# Patient Record
Sex: Male | Born: 1995 | Race: White | Hispanic: No | Marital: Single
Health system: Midwestern US, Community
[De-identification: ages and names within clinical notes are randomized; demographics above are authoritative.]

## PROBLEM LIST (undated history)

## (undated) DIAGNOSIS — I868 Varicose veins of other specified sites: Secondary | ICD-10-CM

---
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
## 2014-05-23 ENCOUNTER — Ambulatory Visit: Payer: Self-pay | Admitting: Internal Medicine

## 2014-05-23 LAB — URINALYSIS, COMPLETE
BLOOD: NEGATIVE
Bacteria: NEGATIVE
Bilirubin,UR: NEGATIVE
Glucose,UR: NEGATIVE
Ketone: NEGATIVE
Leukocyte Esterase: NEGATIVE
Nitrite: NEGATIVE
PH: 6 (ref 5.0–8.0)
Protein: NEGATIVE
Specific Gravity: 1.025 (ref 1.000–1.030)
Squamous Epithelial: NONE SEEN

## 2014-05-23 LAB — GC/CHLAMYDIA PROBE AMP

## 2014-11-05 NOTE — Patient Instructions (Signed)
Attempted to reach patient by telephone to review preop instructions. No answer at telephone number listed. Name on voicemail greeting does not match patients name, no message left.

## 2014-11-06 ENCOUNTER — Encounter

## 2014-11-06 ENCOUNTER — Inpatient Hospital Stay: Admit: 2014-11-06 | Attending: Urology | Primary: Family Medicine

## 2014-11-06 DIAGNOSIS — I868 Varicose veins of other specified sites: Secondary | ICD-10-CM

## 2014-11-06 LAB — POCT VENOUS
CO2: 27 mmol/L (ref 21–32)
Calcium, Ionized: 1.31 mmol/L (ref 1.12–1.32)
GFR African American: >60
GFR Non-African American: >60 (ref >60)
POC Anion Gap: 12 (ref 10–20)
POC BUN: 15 mg/dL (ref 7–18)
POC Chloride: 100 mmol/L (ref 99–110)
POC Creatinine: 1.1 mg/dL (ref 0.9–1.3)
POC Glucose: 84 mg/dl (ref 70–99)
POC Potassium: 3.8 mmol/L (ref 3.5–5.1)
POC Sodium: 139 mmol/L (ref 136–145)

## 2014-11-06 LAB — CBC
Hematocrit: 47.5 % (ref 40.5–52.5)
Hemoglobin: 16.1 g/dL (ref 13.5–17.5)
MCH: 31.9 pg (ref 26.0–34.0)
MCHC: 33.9 g/dL (ref 31.0–36.0)
MCV: 93.9 fL (ref 80.0–100.0)
MPV: 9.5 fL (ref 5.0–10.5)
Platelets: 183 10*3/uL (ref 135–450)
RBC: 5.05 M/uL (ref 4.20–5.90)
RDW: 13 % (ref 12.4–15.4)
WBC: 5.1 10*3/uL (ref 4.0–11.0)

## 2014-11-06 LAB — PROTIME-INR
INR: 1.04 (ref 0.85–1.16)
Protime: 11.9 s (ref 9.8–13.0)

## 2014-11-06 MED ORDER — ONDANSETRON HCL 4 MG/2ML IJ SOLN
4 MG/2ML | Freq: Three times a day (TID) | INTRAMUSCULAR | Status: DC | PRN
Start: 2014-11-06 — End: 2014-11-07

## 2014-11-06 MED ORDER — HEPARIN (PORCINE) IN NACL 2-0.9 UNIT/ML-% IJ SOLN
INTRAMUSCULAR | Status: AC
Start: 2014-11-06 — End: ?

## 2014-11-06 MED ORDER — FENTANYL CITRATE (PF) 100 MCG/2ML IJ SOLN
100 MCG/2ML | INTRAMUSCULAR | Status: AC
Start: 2014-11-06 — End: ?

## 2014-11-06 MED ORDER — MIDAZOLAM HCL 2 MG/2ML IJ SOLN
2 MG/ML | INTRAMUSCULAR | Status: AC
Start: 2014-11-06 — End: ?

## 2014-11-06 MED ORDER — ACETAMINOPHEN 325 MG PO TABS
325 MG | ORAL | Status: DC | PRN
Start: 2014-11-06 — End: 2014-11-07

## 2014-11-06 MED ORDER — LIDOCAINE HCL 2 % IJ SOLN
2 % | INTRAMUSCULAR | Status: AC
Start: 2014-11-06 — End: ?

## 2014-11-06 MED ORDER — SODIUM TETRADECYL SULFATE 3 % IV SOLN
3 % | Freq: Once | INTRAVENOUS | Status: DC
Start: 2014-11-06 — End: 2014-11-07

## 2014-11-06 MED ORDER — PROMETHAZINE HCL 25 MG/ML IJ SOLN
25 MG/ML | INTRAMUSCULAR | Status: AC
Start: 2014-11-06 — End: ?

## 2014-11-06 MED FILL — PROMETHAZINE HCL 25 MG/ML IJ SOLN: 25 MG/ML | INTRAMUSCULAR | Qty: 1

## 2014-11-06 MED FILL — ONDANSETRON HCL 4 MG/2ML IJ SOLN: 4 MG/2ML | INTRAMUSCULAR | Qty: 2

## 2014-11-06 MED FILL — HEPARIN (PORCINE) IN NACL 2-0.9 UNIT/ML-% IJ SOLN: INTRAMUSCULAR | Qty: 500

## 2014-11-06 MED FILL — FENTANYL CITRATE (PF) 100 MCG/2ML IJ SOLN: 100 MCG/2ML | INTRAMUSCULAR | Qty: 2

## 2014-11-06 MED FILL — MIDAZOLAM HCL 2 MG/2ML IJ SOLN: 2 MG/ML | INTRAMUSCULAR | Qty: 2

## 2014-11-06 MED FILL — ACETAMINOPHEN 325 MG PO TABS: 325 MG | ORAL | Qty: 2

## 2014-11-06 MED FILL — LIDOCAINE HCL 2 % IJ SOLN: 2 % | INTRAMUSCULAR | Qty: 20

## 2014-11-06 MED FILL — SOTRADECOL 3 % IV SOLN: 3 % | INTRAVENOUS | Qty: 2

## 2014-11-06 NOTE — Other (Signed)
Sedation Post Procedure Note    Patient Name: Ricardo Moore   Date of Birth:08/02/1995  Room/Bed: Room/bed info not found  Medical Record Number: [id number]  Date: 11/06/2014   Time: 11:16 AM         Physicians/Assistants: Ulla GalloJoseph E Soffia Doshier, MD    Procedure Performed:  Left varicocele embolization and STS sclerosis    Post-Sedation Vital Signs:  There were no vitals filed for this visit.   Vital signs were reviewed and were stable after the procedure (see flow sheet for vitals)            Post-Sedation Exam: Lungs: clear           Complications: none    Electronically signed by Ulla GalloJoseph E Frankye Schwegel, MD on 11/06/2014 at 11:16 AM

## 2014-11-06 NOTE — Brief Op Note (Signed)
Brief Postoperative Note    Ricardo Moore  Date of Birth:  03/11/1996  [phone]    Pre-operative Diagnosis: left varicocele embolization and sclerotherapy    Post-operative Diagnosis: Same    Procedure: same as above    Anesthesia: Local and Moderate Sedation    Surgeons/Assistants: Robb MatarBernstein    Estimated Blood Loss: less than 5cc    Complications: None    Specimens: Was Not Obtained    Findings: good stasis    Electronically signed by Ulla GalloJoseph E Chariti Havel, MD on 11/06/2014 at 11:17 AM

## 2014-11-06 NOTE — Pre Sedation (Signed)
Sedation Pre-Procedure Note    Patient Name: Ricardo Moore   Date of Birth:01/21/1996  Room/Bed: Room/bed info not found  Medical Record Number: [id number] [id number]  Date: 11/06/2014   Time: 11:10 AM       Indication:  Recurrent varicocle    Consent: I have discussed with the patient and/or the patient representative the indication, alternatives, and the possible risks and/or complications of the planned procedure and the anesthesia methods. The patient and/or patient representative appear to understand and agree to proceed.    Vital Signs:   There were no vitals filed for this visit.    Past Medical History:   has no past medical history on file.    Past Surgical History:   has past surgical history that includes Varicocelectomy (Left).    Medications:   Scheduled Meds:   ??? sodium tetradecyl sulfate  2 mL Intravenous Once     Continuous Infusions:    PRN Meds: acetaminophen, ondansetron  Home Meds:   Prior to Admission medications    Not on File     Coumadin Use Last 7 Days:  no  Antiplatelet drug therapy use last 7 days: no  Other anticoagulant use last 7 days: no  Additional Medication Information:  -      Pre-Sedation Documentation and Exam:   I have personally completed a history, physical exam & review of systems for this patient (see notes).    Mallampati Airway Assessment:  normal    Prior History of Anesthesia Complications:   none    ASA Classification:  Class 1 - A normal healthy patient    Sedation/ Anesthesia Plan:   intravenous sedation    Medications Planned:   midazolam (Versed) intravenously and fentanyl intravenously    Patient is an appropriate candidate for plan of sedation: yes    Electronically signed by Ulla GalloJoseph E Shanyn Preisler, MD on 11/06/2014 at 11:10 AM

## 2014-11-06 NOTE — H&P (Signed)
IR   History and Physical       Chief Complaint: recurrent varicocele  left    History of Present Illness:    Ricardo Moore is a 19 y.o. male who presents with pain left scrotum  Recurrent varicocle    Past Medical History:    History reviewed. No pertinent past medical history.    Past Surgical History:           Procedure Laterality Date   ??? Varicocelectomy Left 2-3 years ago       Allergies:  Review of patient's allergies indicates no known allergies.    Medications:   Home Meds  No current outpatient prescriptions on file prior to encounter.     No current facility-administered medications on file prior to encounter.       Current Meds  none  Family History:   History reviewed. No pertinent family history.    Social History:   TOBACCO:   reports that he has never smoked. He does not have any smokeless tobacco history on file.  ETOH:   has no alcohol history on file.  DRUGS:   has no drug history on file.    ROS: A 10 point review of systems was conducted, significant findings as noted in HPI.    Physical exam:     There were no vitals filed for this visit.    General appearance: alert, resting in bed  Neuro: A&Ox3, PERRL  Neck: trachea midline, no JVD  Lungs: CTAB, no r/r/w  Heart: RRR, no m/r/g  Abdomen: soft, non-tender, non-distended, +BS  Skin: pink, no rashes  Extremities: no edema, cap refill<2sec     Labs:    CBC: Recent Labs      11/06/14   0705   WBC  5.1   HGB  16.1   HCT  47.5   MCV  93.9   PLT  183     BMP: Recent Labs      11/06/14   0710   CO2  27   CREATININE  1.1     PT/INR:   Recent Labs      11/06/14   0705   PROTIME  11.9   INR  1.04     APTT: No results for input(s): APTT in the last 72 hours.  Liver Profile:  No results found for: AST, ALT, ALB, BILIDIR, BILITOT, ALKPHOS, GGT, 5NUCNo results found for: CHOL, HDL, TRIG  UA: No results found for: NITRITE, COLORU, PHUR, LABCAST, WBCUA, RBCUA, MUCUS, TRICHOMONAS, YEAST, BACTERIA, CLARITYU, SPECGRAV, LEUKOCYTESUR, UROBILINOGEN, BILIRUBINUR,  BLOODU, GLUCOSEU, AMORPHOUS    US  Done at urology group    Assessment/Plan:  19 y.o. male who presents with recurrent varicocele left side    Cleared for  embolization    Ulla GalloJoseph E Abhi Moccia, MD  11/06/2014 800am  [phone]

## 2015-02-06 DIAGNOSIS — R109 Unspecified abdominal pain: Secondary | ICD-10-CM

## 2015-02-06 DIAGNOSIS — B081 Molluscum contagiosum: Secondary | ICD-10-CM

## 2015-02-27 DIAGNOSIS — R197 Diarrhea, unspecified: Secondary | ICD-10-CM | POA: Diagnosis not present

## 2015-03-11 ENCOUNTER — Ambulatory Visit: Payer: Self-pay | Admitting: Gastroenterology

## 2015-03-18 ENCOUNTER — Ambulatory Visit: Payer: Self-pay | Admitting: Gastroenterology

## 2015-05-23 ENCOUNTER — Other Ambulatory Visit: Payer: Self-pay | Admitting: Family Medicine

## 2015-05-23 ENCOUNTER — Ambulatory Visit
Admission: RE | Admit: 2015-05-23 | Discharge: 2015-05-23 | Disposition: A | Discharge status: Home / Self Care | Payer: Managed Care, Other (non HMO) | Source: Ambulatory Visit | Attending: Family Medicine | Admitting: Family Medicine

## 2015-05-23 DIAGNOSIS — H5712 Ocular pain, left eye: Secondary | ICD-10-CM | POA: Diagnosis not present

## 2015-05-23 DIAGNOSIS — N50812 Left testicular pain: Secondary | ICD-10-CM | POA: Diagnosis not present

## 2015-05-23 DIAGNOSIS — N50819 Testicular pain, unspecified: Secondary | ICD-10-CM

## 2015-05-23 DIAGNOSIS — H0019 Chalazion unspecified eye, unspecified eyelid: Secondary | ICD-10-CM | POA: Diagnosis not present

## 2016-02-27 ENCOUNTER — Encounter: Payer: Self-pay | Admitting: Family Medicine

## 2016-02-27 ENCOUNTER — Ambulatory Visit (INDEPENDENT_AMBULATORY_CARE_PROVIDER_SITE_OTHER): Payer: Managed Care, Other (non HMO) | Admitting: Family Medicine

## 2016-02-27 VITALS — BP 131/65 | HR 51 | Temp 96.7°F

## 2016-02-27 DIAGNOSIS — B079 Viral wart, unspecified: Secondary | ICD-10-CM

## 2016-02-27 DIAGNOSIS — R21 Rash and other nonspecific skin eruption: Secondary | ICD-10-CM

## 2016-02-27 NOTE — Progress Notes (Signed)
Patient presents today with symptoms of rash. Patient states that girlfriend noticed rash on back a few days ago. Patient states that the rash is not itchy or painful. The trainer put some hydrocortisone cream on it yesterday.  Patient also has a viral wart on his right third digit on the palmar aspect. He would like to know if I can freeze the wart.  ROS: Negative except mentioned above.  Vitals as per Epic.  GENERAL: NAD RESP: CTA B CARD: RRR SKIN: Oval and circular flat rash on back, there does appear to be some scaliness within the center, there are no streaks or any discharge from the rash, possible Jake SharkHarold Patch noted in the left groin area Approximately 3 mm viral wart on palmar aspect of right third digit NEURO: CN II-XII grossly intact   A/P: 1)Skin Rash - unsure whether the hydrocortisone has changed the color of the rash, appears to look more like pityriasis rosea or tinea versicolor, patient is not bothered by the rash, would recommend oral antihistamine when necessary if itching, follow-up if symptoms persist or worsen as discussed.  2)Wart on finger - verbal consent was obtained and Histofreeze was used, cryotherapy was used on the area, would recommend that patient keep area clean and dry, repeat procedure may be needed if wart persistent, follow-up when necessary.

## 2016-03-04 ENCOUNTER — Ambulatory Visit: Payer: Managed Care, Other (non HMO) | Admitting: Family Medicine

## 2016-03-05 ENCOUNTER — Ambulatory Visit: Payer: Managed Care, Other (non HMO) | Admitting: Family Medicine

## 2016-03-05 ENCOUNTER — Ambulatory Visit (INDEPENDENT_AMBULATORY_CARE_PROVIDER_SITE_OTHER): Payer: Managed Care, Other (non HMO) | Admitting: Family Medicine

## 2016-03-05 VITALS — Temp 97.1°F

## 2016-03-05 DIAGNOSIS — B360 Pityriasis versicolor: Secondary | ICD-10-CM

## 2016-03-05 MED ORDER — FLUCONAZOLE 150 MG PO TABS: ORAL_TABLET | ORAL | 0 refills | Status: AC

## 2016-03-12 NOTE — Progress Notes (Signed)
Patient presents for follow-up regarding his rash on his torso. Patient states that the areas are more hypopigmented now he denies any itching of the site. He denies any drainage or discharge from the site. There was some confusion initially as to the etiology when he came initially because he had been applying hydrocortisone to the area.  ROS: Negative except mentioned above.  Vitals as per Epic.  GENERAL: NAD HEENT: no pharyngeal erythema, no exudate, no erythema of TMs, no cervical LAD RESP: CTA B CARD: RRR SKIN: Hypopigmented areas noted on the upper back primarily, no vesicular lesions or discharge NEURO: CN II-XII groslly intact   A/P: Skin rash -etiology appears to be tenia versicolor, will treat with oral Diflucan weekly for 4 weeks and also can use Selsun Blue on the area wash off after 10 minutes for 1 week. If symptoms do persist or worsen I will recommend referral to dermatology for further evaluation and treatment.

## 2016-04-14 IMAGING — US US SCROTUM W/ DOPPLER COMPLETE
1 series · 13 of 25 positions shown · non-contrast
Comparison: None.

CLINICAL DATA: Left-sided testicular pain, ablation of varicocele
in 0410

EXAM:
SCROTAL ULTRASOUND
DOPPLER ULTRASOUND OF THE TESTICLES
TECHNIQUE: Complete ultrasound examination of the testicles, epididymis, and
other scrotal structures was performed. Color and spectral Doppler
ultrasound were also utilized to evaluate blood flow to the
testicles.

[Series 1: us scrotum w/ doppler complete · 0.09mm/px · 13 of 61 slices shown]
[im 1/61]
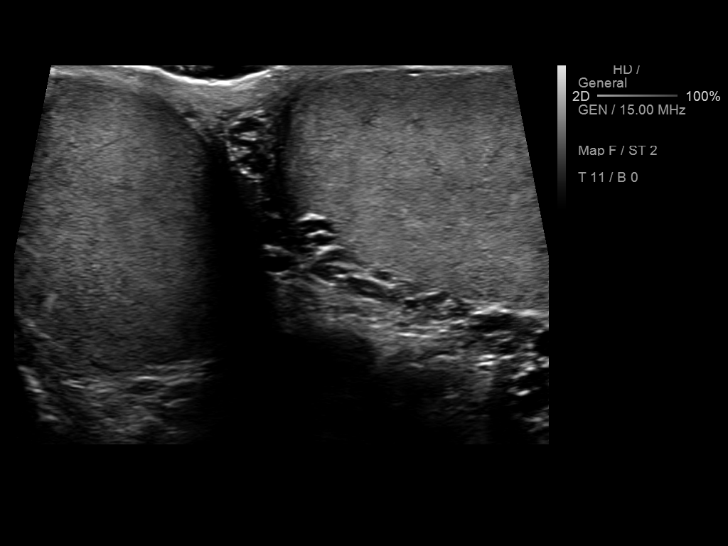
[im 6/61]
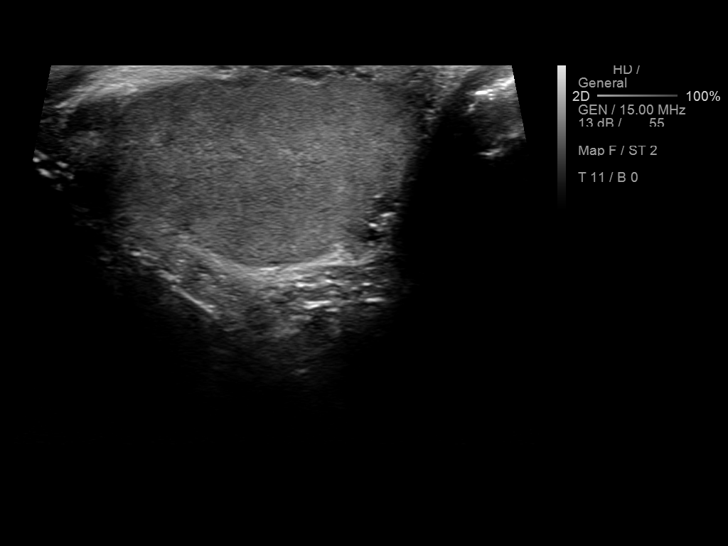
[im 11/61]
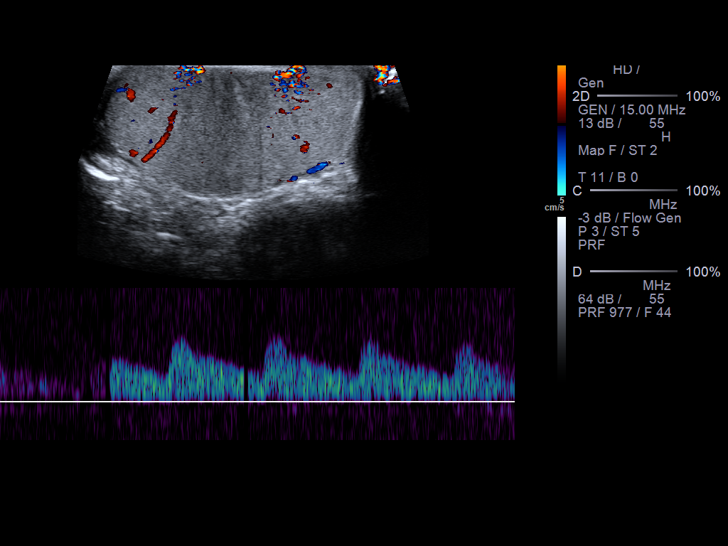
[im 16/61]
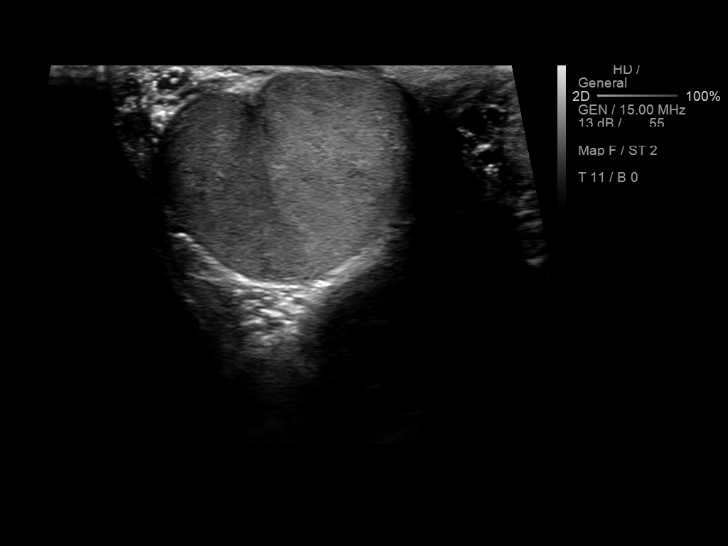
[im 21/61]
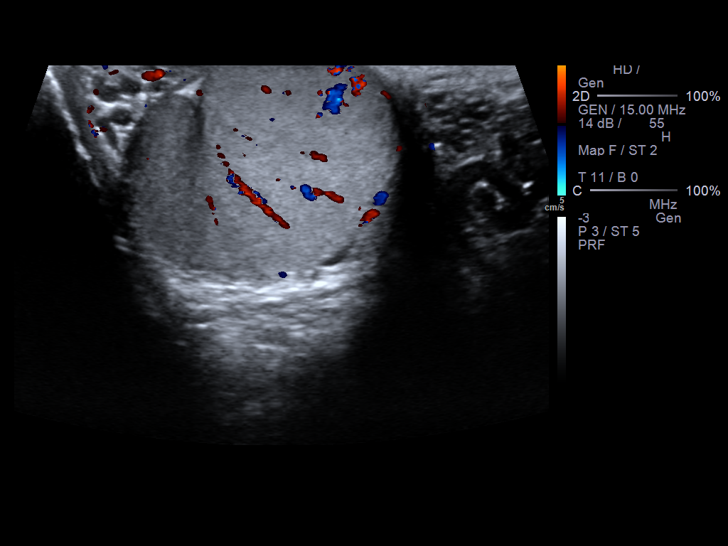
[im 26/61]
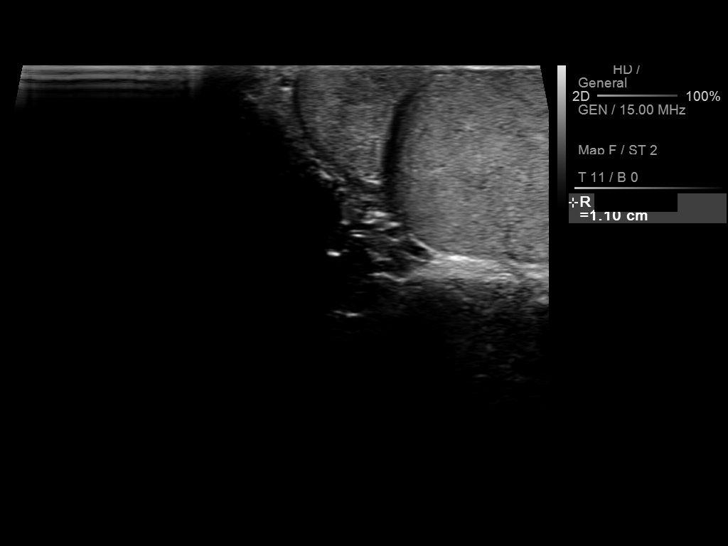
[im 31/61]
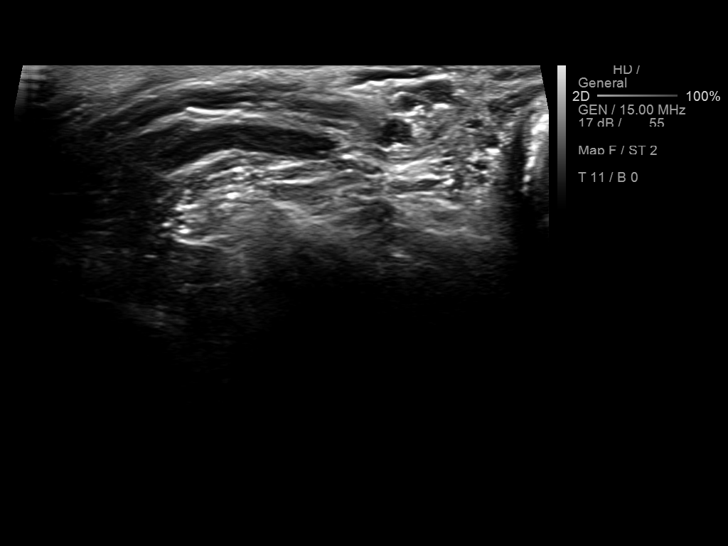
[im 36/61]
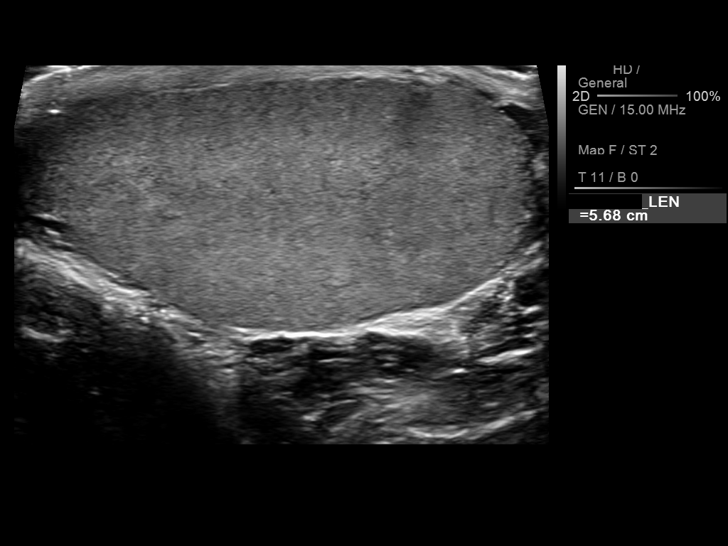
[im 41/61]
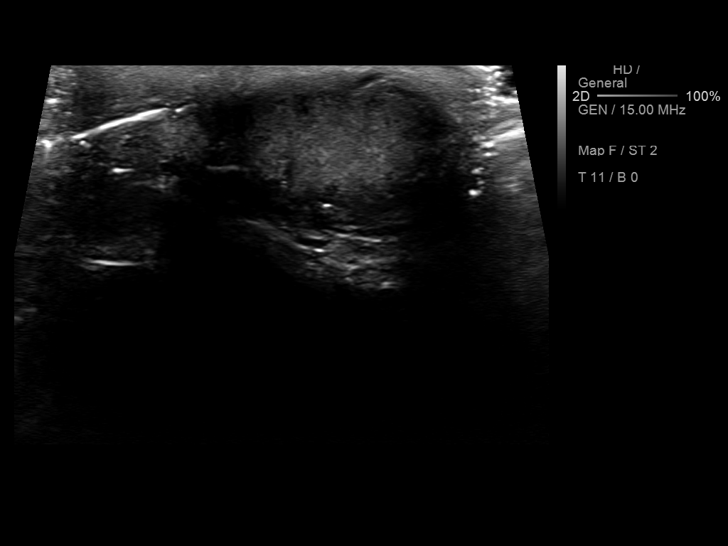
[im 46/61]
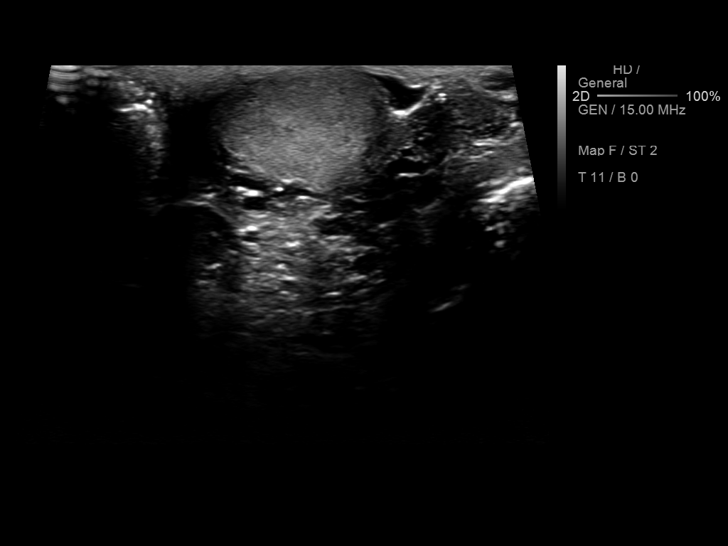
[im 51/61]
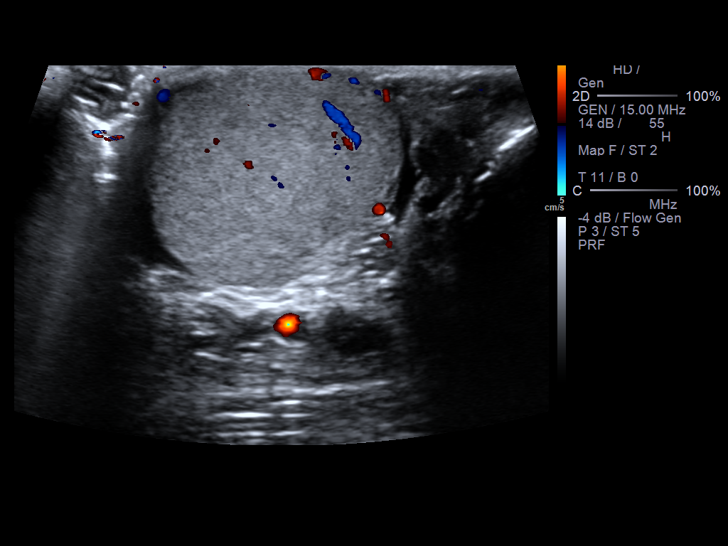
[im 56/61]
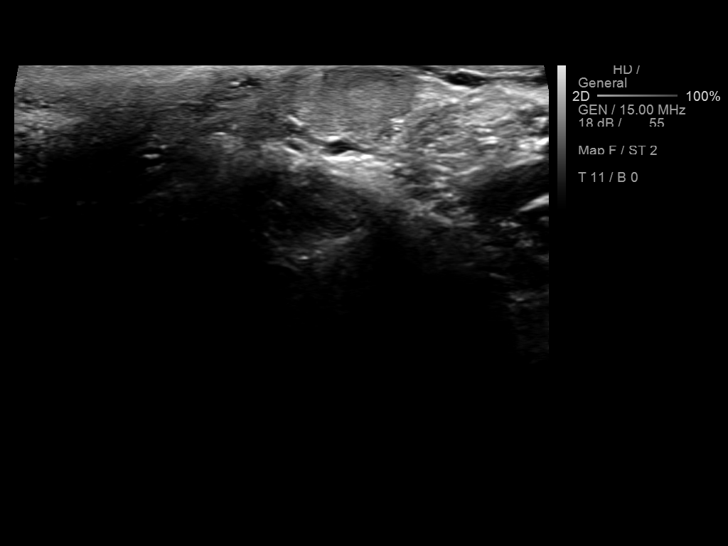
[im 61/61]
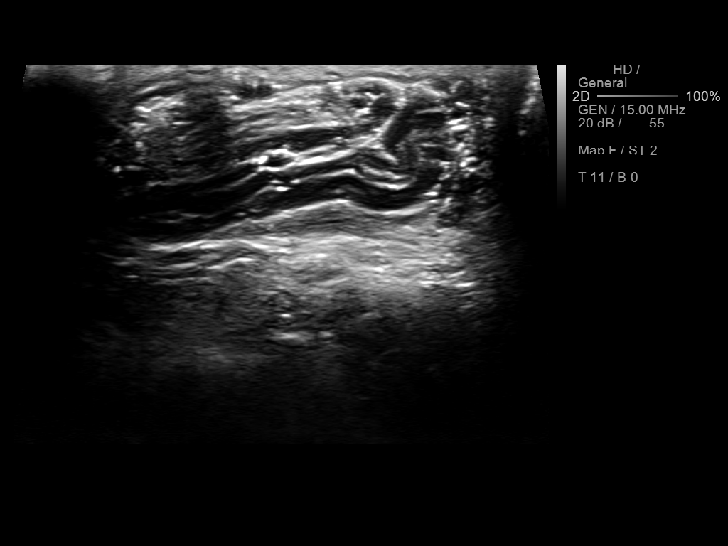

[13 of 25 positions shown; findings below may reference images not displayed]

FINDINGS: Right testicle

Measurements: The right testicle is normal in size measuring 5.5 x
2.9 x 3.2 cm. No intratesticular abnormality is seen. Blood flow is
demonstrated to the right testicle with arterial and venous
waveforms..

Left testicle

Measurements: The left testicle is normal measuring 5.7 x 2.6 x
cm.. No intratesticular abnormality is seen. Blood flow is
demonstrated to the left testicle with arterial and venous
waveforms.

Right epididymis: A small right of epididymal cyst is noted of
mm.

Left epididymis: No abnormality is seen involving the left
epididymis.

Hydrocele:  No hydrocele is noted.

Varicocele: There are varicoceles present bilaterally, more
prominent on the left.

Pulsed Doppler interrogation of both testes demonstrates low
resistance arterial and venous waveforms bilaterally.
IMPRESSION: 1. Bilateral varicoceles, more prominent on the left.
2. No intratesticular abnormality is seen. Blood flow is
demonstrated to both testicles.

## 2016-06-28 ENCOUNTER — Ambulatory Visit (INDEPENDENT_AMBULATORY_CARE_PROVIDER_SITE_OTHER): Payer: Managed Care, Other (non HMO) | Admitting: Family Medicine

## 2016-06-28 ENCOUNTER — Encounter: Payer: Self-pay | Admitting: Family Medicine

## 2016-06-28 VITALS — BP 108/70 | HR 77 | Temp 97.9°F

## 2016-06-28 DIAGNOSIS — R509 Fever, unspecified: Secondary | ICD-10-CM | POA: Diagnosis not present

## 2016-06-28 DIAGNOSIS — K529 Noninfective gastroenteritis and colitis, unspecified: Secondary | ICD-10-CM | POA: Diagnosis not present

## 2016-06-28 LAB — POCT INFLUENZA A/B

## 2016-06-28 MED ORDER — ONDANSETRON 4 MG PO TBDP: 4.0000 mg | ORAL_TABLET | Freq: Three times a day (TID) | ORAL | 0 refills | Status: AC | PRN

## 2016-06-28 NOTE — Progress Notes (Signed)
Patient presents today with symptoms of vomiting and diarrhea. Patient also has had a subjective fever with myalgias and chills. Patient states that his symptoms started at 2 PM yesterday. The vomiting has stopped however the patient continues to have myalgias and diarrhea. He denies any focal area of abdominal pain at this time. He denies any significant alcohol use or any other drug use. He has been taking Tylenol for his symptoms. He was able to drink Gatorade earlier this morning. He has some nasal congestion however does not have any sore throat or cough or any neck stiffness or photophobia. He denies any blood in his stool and has not been dealing with constipation prior to this.  ROS: Negative except mentioned above. VITALS: AS PER EPIC GENERAL: NAD HEENT: no pharyngeal erythema, no exudate, no erythema of TMs, no cervical LAD RESP: CTA B CARD: RRR ABD: Positive bowel sounds, nontender, no rebound or guarding appreciated, no flank tenderness NEURO: CN II-XII grossly intact   A/P: Gastroenteritis - influenza test negative, rest, hydration, BRAT diet, Zofran when necessary, seek medical attention if symptoms persist or worsen as discussed. No athletic activity for now. Tylenol when necessary. Recommend patient avoid contact with other basketball players and classmates. Should get accommodations for exams at her scheduled for tomorrow.

## 2017-04-26 IMAGING — US US SCROTUM
1 series · 13 of 25 positions shown · non-contrast
Comparison: 05/23/2014

CLINICAL DATA: 19-year-old male with 2 weeks of left testicular
pain. Initial encounter.

EXAM:
SCROTAL ULTRASOUND
DOPPLER ULTRASOUND OF THE TESTICLES
TECHNIQUE: Complete ultrasound examination of the testicles, epididymis, and
other scrotal structures was performed. Color and spectral Doppler
ultrasound were also utilized to evaluate blood flow to the
testicles.

[Series 1: us scrotum · 0.08mm/px · 13 of 78 slices shown]
[im 1/78]
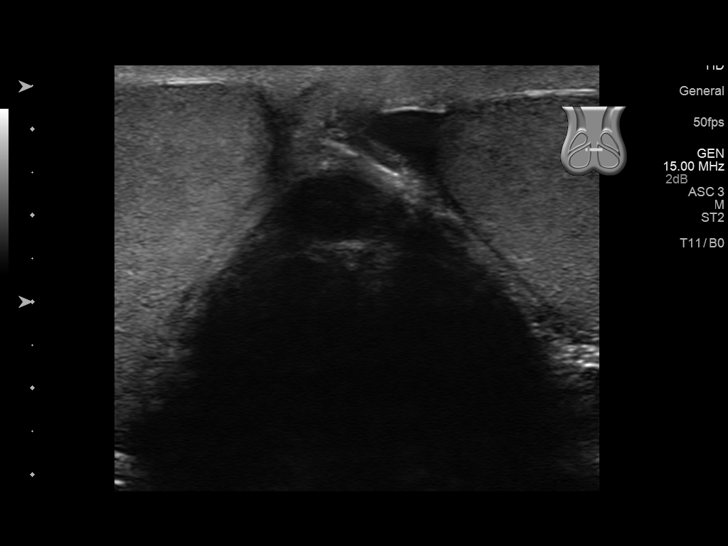
[im 7/78]
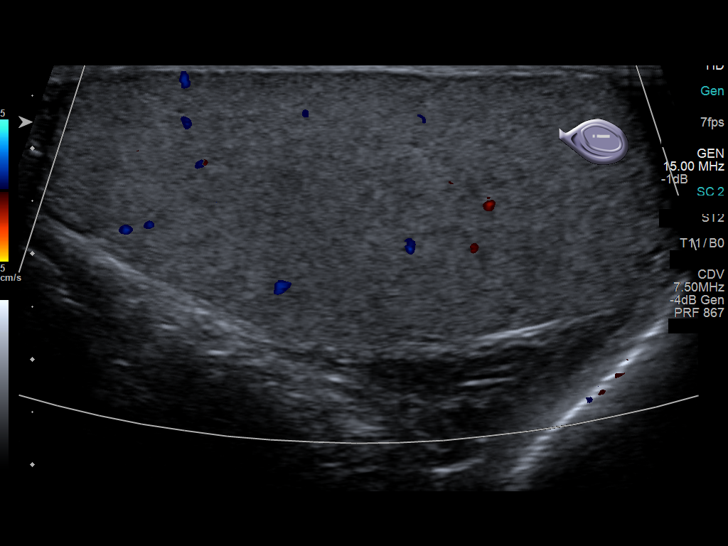
[im 13/78]
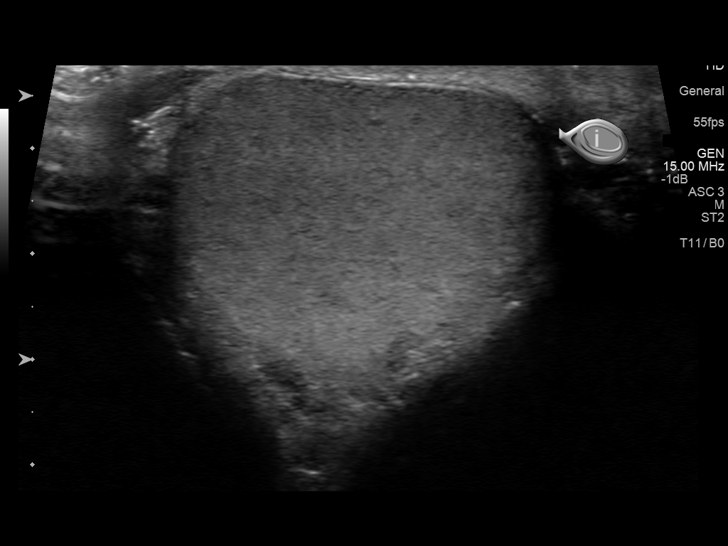
[im 20/78]
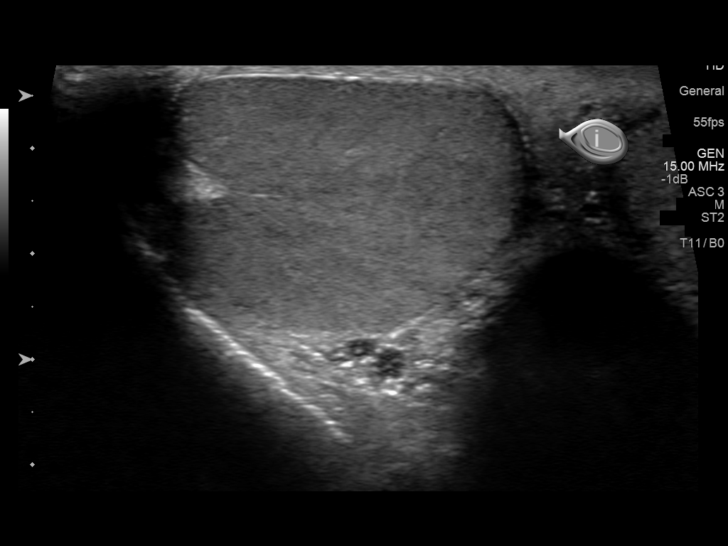
[im 26/78]
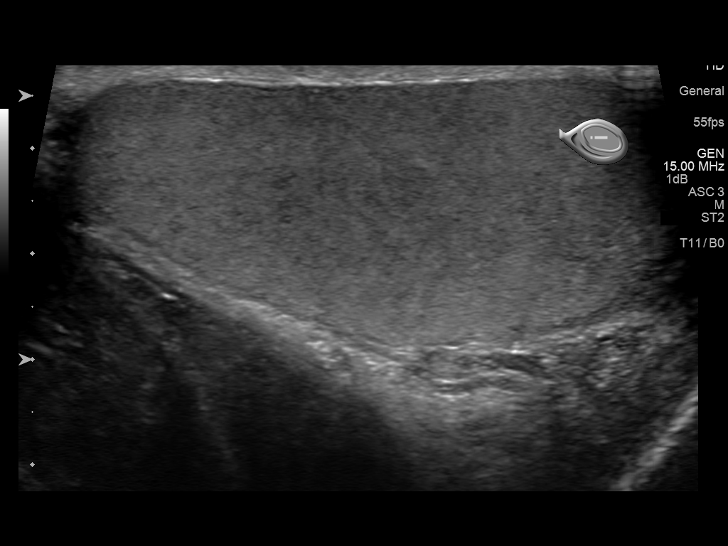
[im 33/78]
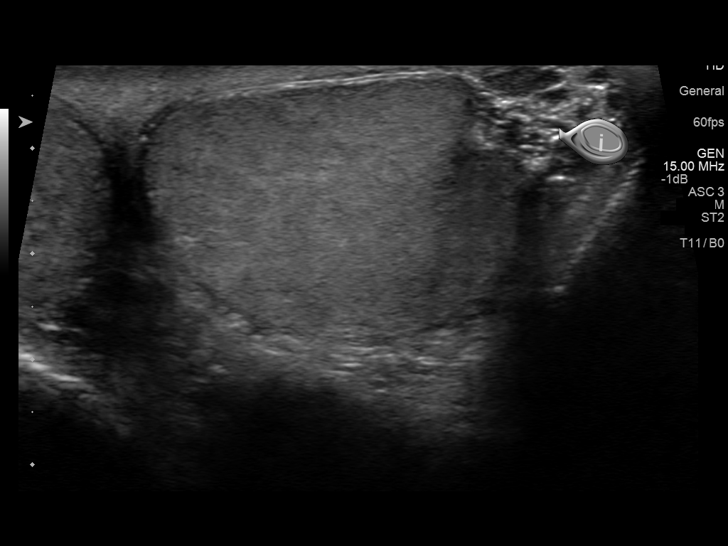
[im 39/78]
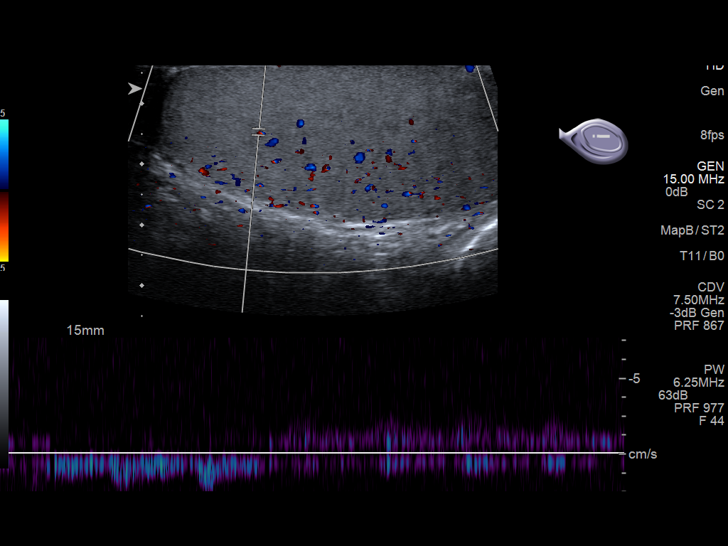
[im 45/78]
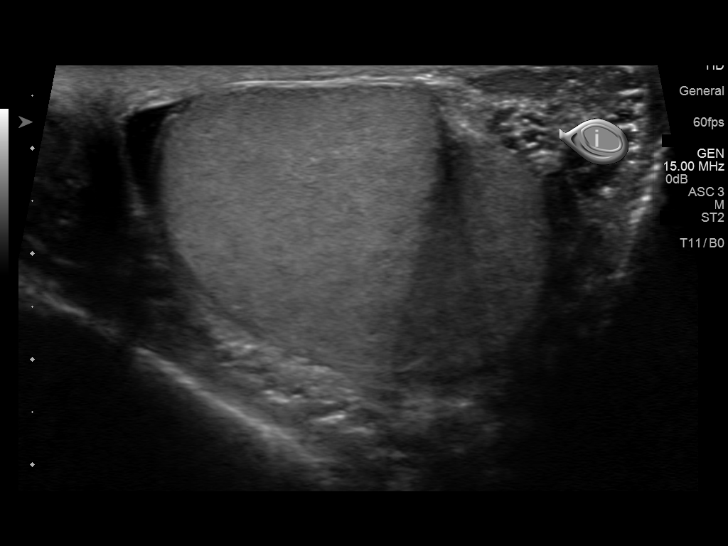
[im 52/78]
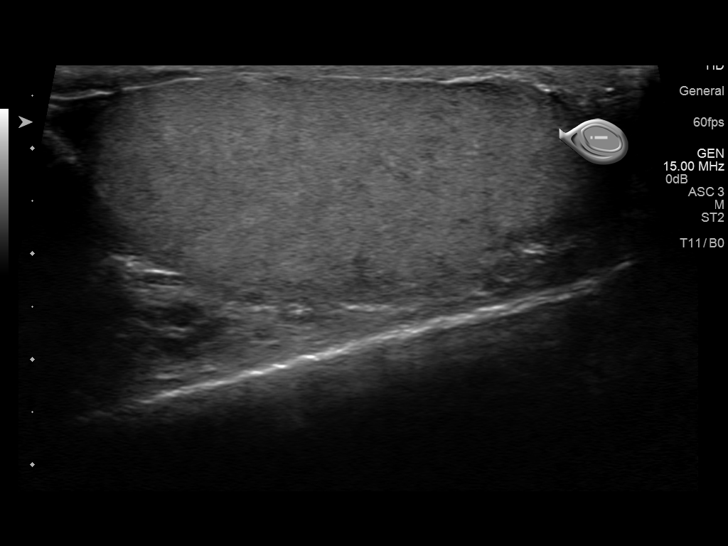
[im 58/78]
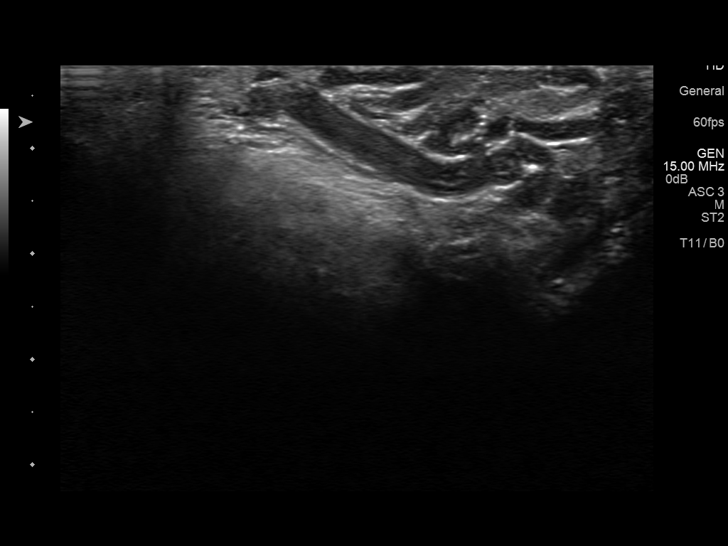
[im 65/78]
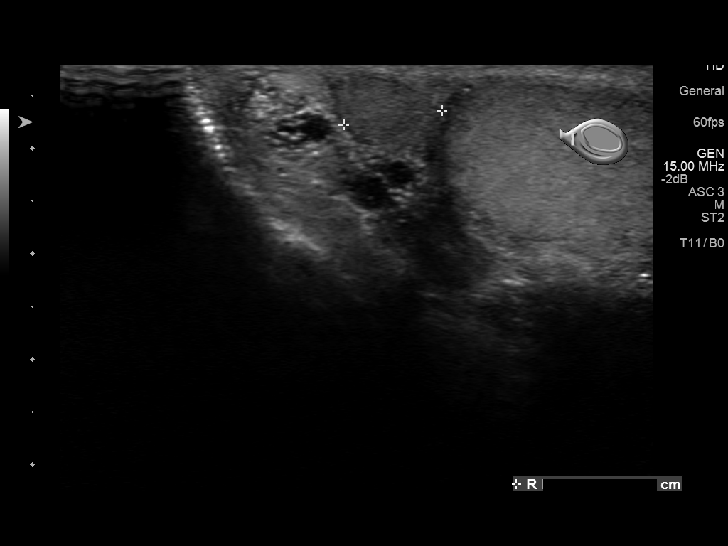
[im 71/78]
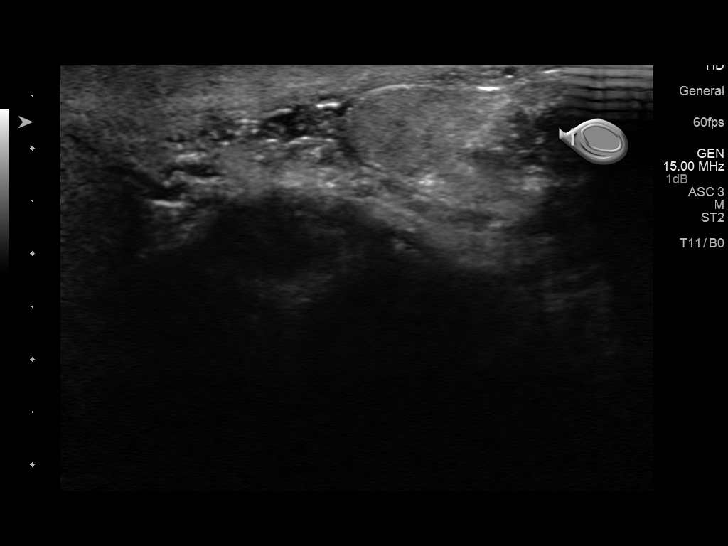
[im 78/78]
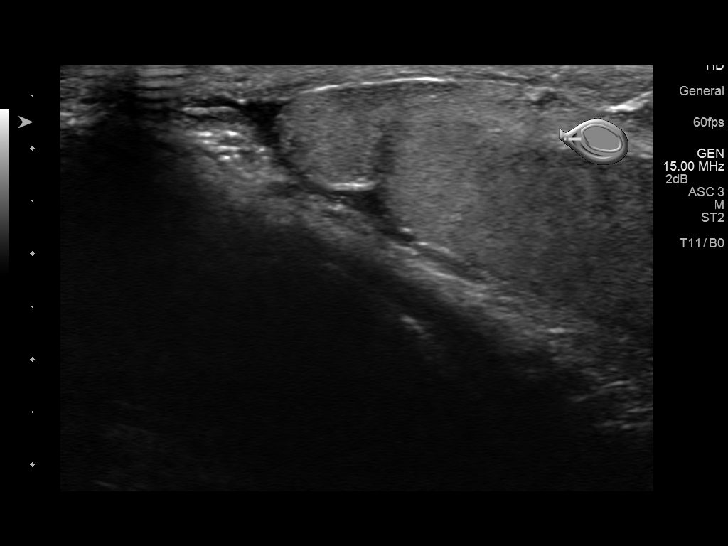

[13 of 25 positions shown; findings below may reference images not displayed]

FINDINGS: Right testicle

Measurements: 5.9 x 2.7 x 3.5 cm (previously 5.5 x 2.9 x 3.2 cm). No
mass or microlithiasis visualized.

Left testicle

Measurements: 6.2 x 2.5 x 3.5 cm (previously 5.7 x 2.6 x 3.4 cm). No
mass or microlithiasis visualized.

Right epididymis:  Normal in size and appearance.

Left epididymis:  Normal in size and appearance.

Hydrocele:  None visualized.

Varicocele: Prominent pampiniform venous plexus bilaterally without
overt varicocele, but on the left there appears to be a small
thrombosed or partially thrombosed vein (image 63).

Pulsed Doppler interrogation of both testes demonstrates normal low
resistance arterial and venous waveforms bilaterally.
IMPRESSION: 1. No testicular torsion. Normal sonographic appearance of the
testes and epididymis.
2. Appearance suspicious for a small thrombosed or partially
thrombosis left pampiniform vein.
